# Patient Record
Sex: Male | Born: 1990 | Race: Asian | Hispanic: No | Marital: Single | Smoking: Never smoker
Health system: Southern US, Community
[De-identification: ages and names within clinical notes are randomized; demographics above are authoritative.]

## PROBLEM LIST (undated history)

## (undated) HISTORY — PX: APPENDECTOMY: SHX54

---
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
## 2017-07-13 ENCOUNTER — Emergency Department (HOSPITAL_BASED_OUTPATIENT_CLINIC_OR_DEPARTMENT_OTHER)
Admission: EM | Admit: 2017-07-13 | Discharge: 2017-07-13 | Disposition: A | Discharge status: Home / Self Care | Payer: No Typology Code available for payment source | Attending: Emergency Medicine | Admitting: Emergency Medicine

## 2017-07-13 ENCOUNTER — Encounter (HOSPITAL_BASED_OUTPATIENT_CLINIC_OR_DEPARTMENT_OTHER): Payer: Self-pay | Admitting: *Deleted

## 2017-07-13 ENCOUNTER — Emergency Department (HOSPITAL_BASED_OUTPATIENT_CLINIC_OR_DEPARTMENT_OTHER): Payer: No Typology Code available for payment source

## 2017-07-13 ENCOUNTER — Other Ambulatory Visit: Payer: Self-pay

## 2017-07-13 DIAGNOSIS — Y9389 Activity, other specified: Secondary | ICD-10-CM | POA: Diagnosis not present

## 2017-07-13 DIAGNOSIS — Y92414 Local residential or business street as the place of occurrence of the external cause: Secondary | ICD-10-CM | POA: Insufficient documentation

## 2017-07-13 DIAGNOSIS — S199XXA Unspecified injury of neck, initial encounter: Secondary | ICD-10-CM | POA: Diagnosis present

## 2017-07-13 DIAGNOSIS — M549 Dorsalgia, unspecified: Secondary | ICD-10-CM | POA: Diagnosis not present

## 2017-07-13 DIAGNOSIS — Y999 Unspecified external cause status: Secondary | ICD-10-CM | POA: Diagnosis not present

## 2017-07-13 DIAGNOSIS — M542 Cervicalgia: Secondary | ICD-10-CM | POA: Diagnosis not present

## 2017-07-13 MED ORDER — NAPROXEN 500 MG PO TABS: 500.0000 mg | ORAL_TABLET | Freq: Two times a day (BID) | ORAL | 0 refills | Status: DC

## 2017-07-13 MED ORDER — OXYCODONE-ACETAMINOPHEN 5-325 MG PO TABS
1.0000 | ORAL_TABLET | Freq: Once | ORAL | Status: AC
  Administered 2017-07-13: 1 via ORAL
  Filled 2017-07-13: qty 1

## 2017-07-13 MED ORDER — METHOCARBAMOL 500 MG PO TABS: 500.0000 mg | ORAL_TABLET | Freq: Three times a day (TID) | ORAL | 0 refills | Status: DC | PRN
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
## 2017-11-15 ENCOUNTER — Emergency Department (HOSPITAL_BASED_OUTPATIENT_CLINIC_OR_DEPARTMENT_OTHER)
Admission: EM | Admit: 2017-11-15 | Discharge: 2017-11-15 | Disposition: A | Discharge status: Home / Self Care | Payer: No Typology Code available for payment source | Attending: Emergency Medicine | Admitting: Emergency Medicine

## 2017-11-15 ENCOUNTER — Encounter (HOSPITAL_BASED_OUTPATIENT_CLINIC_OR_DEPARTMENT_OTHER): Payer: Self-pay | Admitting: Emergency Medicine

## 2017-11-15 ENCOUNTER — Other Ambulatory Visit: Payer: Self-pay

## 2017-11-15 DIAGNOSIS — Y998 Other external cause status: Secondary | ICD-10-CM | POA: Insufficient documentation

## 2017-11-15 DIAGNOSIS — S161XXA Strain of muscle, fascia and tendon at neck level, initial encounter: Secondary | ICD-10-CM

## 2017-11-15 DIAGNOSIS — Y9389 Activity, other specified: Secondary | ICD-10-CM | POA: Diagnosis not present

## 2017-11-15 DIAGNOSIS — S39012A Strain of muscle, fascia and tendon of lower back, initial encounter: Secondary | ICD-10-CM | POA: Diagnosis not present

## 2017-11-15 DIAGNOSIS — Y92410 Unspecified street and highway as the place of occurrence of the external cause: Secondary | ICD-10-CM | POA: Diagnosis not present

## 2017-11-15 DIAGNOSIS — S199XXA Unspecified injury of neck, initial encounter: Secondary | ICD-10-CM | POA: Diagnosis present

## 2017-11-15 MED ORDER — METHOCARBAMOL 500 MG PO TABS: 1000.0000 mg | ORAL_TABLET | Freq: Four times a day (QID) | ORAL | 0 refills | Status: AC

## 2017-11-15 MED ORDER — NAPROXEN 500 MG PO TABS: 500.0000 mg | ORAL_TABLET | Freq: Two times a day (BID) | ORAL | 0 refills | Status: AC

## 2017-11-15 MED FILL — METHOCARBAMOL 500 MG TABLET: 500 | 3 days supply | Qty: 20 | Fill #0

## 2017-11-15 MED FILL — NAPROXEN 500 MG TABLET: 500 | 10 days supply | Qty: 20 | Fill #0
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
